# Patient Record
Sex: Male | Born: 1935 | Race: White | Hispanic: No
Health system: Southern US, Community
[De-identification: ages and names within clinical notes are randomized; demographics above are authoritative.]

## PROBLEM LIST (undated history)

## (undated) DIAGNOSIS — I4891 Unspecified atrial fibrillation: Secondary | ICD-10-CM

## (undated) DIAGNOSIS — C439 Malignant melanoma of skin, unspecified: Secondary | ICD-10-CM

## (undated) DIAGNOSIS — N400 Enlarged prostate without lower urinary tract symptoms: Secondary | ICD-10-CM

## (undated) DIAGNOSIS — I10 Essential (primary) hypertension: Secondary | ICD-10-CM

## (undated) DIAGNOSIS — C801 Malignant (primary) neoplasm, unspecified: Secondary | ICD-10-CM

---

## 2005-04-21 ENCOUNTER — Ambulatory Visit: Payer: Self-pay | Admitting: Gastroenterology

## 2006-04-19 ENCOUNTER — Ambulatory Visit (HOSPITAL_BASED_OUTPATIENT_CLINIC_OR_DEPARTMENT_OTHER): Admission: RE | Admit: 2006-04-19 | Discharge: 2006-04-19 | Payer: Self-pay | Admitting: Orthopedic Surgery

## 2006-06-24 ENCOUNTER — Ambulatory Visit: Payer: Self-pay | Admitting: Cardiology

## 2006-06-24 ENCOUNTER — Other Ambulatory Visit: Payer: Self-pay

## 2008-01-02 ENCOUNTER — Ambulatory Visit: Payer: Self-pay | Admitting: Urology

## 2008-01-02 ENCOUNTER — Ambulatory Visit: Payer: Self-pay | Admitting: Cardiology

## 2008-01-02 ENCOUNTER — Other Ambulatory Visit: Payer: Self-pay

## 2008-01-18 ENCOUNTER — Inpatient Hospital Stay: Payer: Self-pay | Admitting: Urology

## 2008-01-21 ENCOUNTER — Emergency Department: Payer: Self-pay | Admitting: Emergency Medicine

## 2008-07-03 ENCOUNTER — Ambulatory Visit: Payer: Self-pay | Admitting: Gastroenterology

## 2009-04-29 ENCOUNTER — Inpatient Hospital Stay (HOSPITAL_COMMUNITY): Admission: RE | Admit: 2009-04-29 | Discharge: 2009-05-01 | Payer: Self-pay | Admitting: Orthopedic Surgery

## 2009-05-21 ENCOUNTER — Encounter: Payer: Self-pay | Admitting: Orthopedic Surgery

## 2009-06-08 ENCOUNTER — Emergency Department: Payer: Self-pay | Admitting: Internal Medicine

## 2009-06-11 ENCOUNTER — Encounter: Payer: Self-pay | Admitting: Orthopedic Surgery

## 2009-07-12 ENCOUNTER — Encounter: Payer: Self-pay | Admitting: Orthopedic Surgery

## 2010-01-27 ENCOUNTER — Ambulatory Visit: Payer: Self-pay | Admitting: Specialist

## 2010-01-29 ENCOUNTER — Ambulatory Visit: Payer: Self-pay | Admitting: Specialist

## 2010-02-04 ENCOUNTER — Ambulatory Visit: Payer: Self-pay | Admitting: Specialist

## 2010-06-29 LAB — CBC
HCT: 43.7 % (ref 39.0–52.0)
HCT: 23.6 % — ABNORMAL LOW (ref 39.0–52.0)
Hemoglobin: 12.1 g/dL — ABNORMAL LOW (ref 13.0–17.0)
Hemoglobin: 8.1 g/dL — ABNORMAL LOW (ref 13.0–17.0)
MCHC: 34.2 g/dL (ref 30.0–36.0)
MCV: 92.7 fL (ref 78.0–100.0)
MCV: 92.8 fL (ref 78.0–100.0)
Platelets: 171 10*3/uL (ref 150–400)
Platelets: 94 10*3/uL — ABNORMAL LOW (ref 150–400)
RDW: 13.9 % (ref 11.5–15.5)
RDW: 14.1 % (ref 11.5–15.5)
WBC: 13.3 10*3/uL — ABNORMAL HIGH (ref 4.0–10.5)
WBC: 8.3 10*3/uL (ref 4.0–10.5)

## 2010-06-29 LAB — DIFFERENTIAL
Basophils Absolute: 0 10*3/uL (ref 0.0–0.1)
Eosinophils Absolute: 0.2 10*3/uL (ref 0.0–0.7)
Eosinophils Relative: 3 % (ref 0–5)
Lymphocytes Relative: 22 % (ref 12–46)
Lymphs Abs: 2 10*3/uL (ref 0.7–4.0)
Monocytes Absolute: 0.8 10*3/uL (ref 0.1–1.0)
Monocytes Relative: 9 % (ref 3–12)
Neutro Abs: 5.9 10*3/uL (ref 1.7–7.7)

## 2010-06-29 LAB — COMPREHENSIVE METABOLIC PANEL
Albumin: 3.7 g/dL (ref 3.5–5.2)
BUN: 17 mg/dL (ref 6–23)
CO2: 24 mEq/L (ref 19–32)
GFR calc Af Amer: >60 mL/min (ref >60)
GFR calc non Af Amer: 59 mL/min — ABNORMAL LOW (ref >60)
Sodium: 139 mEq/L (ref 135–145)
Total Protein: 6.5 g/dL (ref 6.0–8.3)

## 2010-06-29 LAB — URINALYSIS, ROUTINE W REFLEX MICROSCOPIC
Bilirubin Urine: NEGATIVE
Glucose, UA: NEGATIVE mg/dL
Ketones, ur: NEGATIVE mg/dL

## 2010-06-29 LAB — BASIC METABOLIC PANEL
BUN: 18 mg/dL (ref 6–23)
CO2: 26 mEq/L (ref 19–32)
CO2: 27 mEq/L (ref 19–32)
Calcium: 8 mg/dL — ABNORMAL LOW (ref 8.4–10.5)
Chloride: 103 mEq/L (ref 96–112)
GFR calc Af Amer: >60 mL/min (ref >60)
GFR calc non Af Amer: 59 mL/min — ABNORMAL LOW (ref >60)
Glucose, Bld: 113 mg/dL — ABNORMAL HIGH (ref 70–99)
Glucose, Bld: 92 mg/dL (ref 70–99)
Potassium: 4.8 mEq/L (ref 3.5–5.1)
Potassium: 5.6 mEq/L — ABNORMAL HIGH (ref 3.5–5.1)
Sodium: 135 mEq/L (ref 135–145)
Sodium: 140 mEq/L (ref 135–145)

## 2010-06-29 LAB — PREPARE RBC (CROSSMATCH)

## 2010-06-29 LAB — URINE CULTURE: Culture: NO GROWTH

## 2010-06-29 LAB — TYPE AND SCREEN
ABO/RH(D): O POS
Antibody Screen: NEGATIVE

## 2010-06-29 LAB — PROTIME-INR
INR: 2.61 — ABNORMAL HIGH (ref 0.00–1.49)
INR: 1.28 (ref 0.00–1.49)
Prothrombin Time: 15.9 seconds — ABNORMAL HIGH (ref 11.6–15.2)

## 2010-06-29 LAB — URINE MICROSCOPIC-ADD ON

## 2010-06-29 LAB — ABO/RH: ABO/RH(D): O POS

## 2010-07-30 ENCOUNTER — Ambulatory Visit: Payer: Self-pay | Admitting: Specialist

## 2010-08-29 NOTE — Op Note (Signed)
NAME:  Louis Simmons, Louis Simmons                ACCOUNT NO.:  [id number]   MEDICAL RECORD NO.:  [id number]          PATIENT TYPE:  AMB   LOCATION:  DSC                          FACILITY:  [facility]   PHYSICIAN:  Robert A. Thurston Hole, M.D. DATE OF BIRTH:  08/24/1933   DATE OF PROCEDURE:  04/19/2006  DATE OF DISCHARGE:                               OPERATIVE REPORT   PREOPERATIVE DIAGNOSES:  Right knee medial and lateral meniscal tears  with degenerative joint disease, chondromalacia and synovitis.   POSTOPERATIVE DIAGNOSES:  Right knee medial and lateral meniscal tears  with degenerative joint disease, chondromalacia and synovitis.   PROCEDURES:  1. Right knee examination under anesthesia, followed by arthroscopic      partial medial and lateral meniscectomies.  2. Right knee chondroplasty with partial synovectomy.   SURGEON:  Elana Alm. Thurston Hole, M.D.   ASSISTANT:  Julien Girt, P.A.   ANESTHESIA:  Local with MAC.   OPERATIVE TIME:  30 minutes.   COMPLICATIONS:  None.   INDICATIONS FOR PROCEDURES:  [name] is a 76 year old gentleman, who  has had 6 months of increasing right knee pain, with exam and MRI  documenting medial and lateral meniscal tears, possible loose bodies,  chondromalacia, DJD and synovitis.  He has failed conservative care and  is now to undergo arthroscopy.   DESCRIPTION:  [name] was brought into the operating room on April 19, 2006, after a knee block was placed in the holding area by  Anesthesia.  He was placed on the operating table in supine position.  His right knee was examined.  Range of motion from 0 to 125 degrees, 1  to 2+ crepitation, and stable ligamentous exam, with normal patellar  tracking.  The right leg was then prepped using sterile DuraPrep and  draped using sterile technique.  Originally, through an anterolateral  portal, the arthroscope with a pump attached was placed, and through an  anteromedial portal an arthroscopic probe was placed.   On initial  inspection of the medial compartment, he was found to have 20% grade 4,  and the rest, grade 3 chondromalacia, which was debrided, and a medial  meniscal tear, posterior and medial horn, of which 50% was resected back  to a stable rim.  Intercondylar notch inspected.  Anterior and posterior  cruciate ligaments were degenerative, but intact.  There were loose  bodies attached to the soft tissue at the base of the ACL, which were  removed.  Lateral compartment showed 50 to 60% grade 3 chondromalacia,  which was debrided.  Lateral meniscal tearing, posterior and lateral  horn, of which 30 to 40% was resected back to a stable rim.  Patellofemoral joint showed 80% grade 4, and the rest grade 3  chondromalacia and DJD, which was debrided.  The patella tracked  normally.  Moderate synovitis in the medial and lateral gutters,  debrided.  Otherwise, they were free of pathology.  After this was done,  it was felt that all pathology had been satisfactorily addressed.  The  instruments were removed.  The portals were closed with 3-0 nylon  suture.  Sterile dressings were applied.  The patient was awakened and  taken to the recovery room in stable condition.   FOLLOWUP CARE:  [name] is to be followed as an outpatient on  Vicodin.  He will be restarted on his Coumadin tonight per Medicine  recommendations.  He will been back in the office in a week for sutures  out and followup.      Robert A. Thurston Hole, M.D.  Electronically Signed     RAW/MEDQ  D:  04/19/2006  T:  04/19/2006  Job:  [id number]

## 2011-08-06 ENCOUNTER — Emergency Department: Payer: Self-pay | Admitting: Emergency Medicine

## 2011-08-06 LAB — CBC WITH DIFFERENTIAL/PLATELET
Basophil %: 0.7 %
Eosinophil #: 0.3 10*3/uL (ref 0.0–0.7)
HCT: 41.9 % (ref 40.0–52.0)
HGB: 13.8 g/dL (ref 13.0–18.0)
MCH: 31.4 pg (ref 26.0–34.0)
MCHC: 33 g/dL (ref 32.0–36.0)
MCV: 95 fL (ref 80–100)
Monocyte #: 0.8 x10 3/mm (ref 0.2–1.0)
Neutrophil %: 64.6 %
Platelet: 165 10*3/uL (ref 150–440)
WBC: 7.6 10*3/uL (ref 3.8–10.6)

## 2011-08-06 LAB — PROTIME-INR: INR: 2.7

## 2011-09-25 ENCOUNTER — Ambulatory Visit: Payer: Self-pay | Admitting: Urology

## 2011-09-29 ENCOUNTER — Ambulatory Visit: Payer: Self-pay | Admitting: Urology

## 2011-09-29 LAB — BASIC METABOLIC PANEL
BUN: 21 mg/dL — ABNORMAL HIGH (ref 7–18)
Calcium, Total: 8.5 mg/dL (ref 8.5–10.1)
Chloride: 111 mmol/L — ABNORMAL HIGH (ref 98–107)
EGFR (African American): 50 — ABNORMAL LOW
EGFR (Non-African Amer.): 44 — ABNORMAL LOW
Osmolality: 283 (ref 275–301)

## 2011-09-29 LAB — PROTIME-INR: Prothrombin Time: 31.9 secs — ABNORMAL HIGH (ref 11.5–14.7)

## 2011-09-29 LAB — CBC WITH DIFFERENTIAL/PLATELET
Basophil #: 0.1 10*3/uL (ref 0.0–0.1)
Basophil %: 0.9 %
Lymphocyte %: 13.3 %
Monocyte #: 0.7 x10 3/mm (ref 0.2–1.0)
Monocyte %: 9.2 %
Neutrophil #: 5.3 10*3/uL (ref 1.4–6.5)
Neutrophil %: 72.3 %
RBC: 4.43 10*6/uL (ref 4.40–5.90)

## 2011-09-29 LAB — APTT: Activated PTT: 45.3 secs — ABNORMAL HIGH (ref 23.6–35.9)

## 2011-10-05 ENCOUNTER — Ambulatory Visit: Payer: Self-pay | Admitting: Urology

## 2013-04-26 ENCOUNTER — Emergency Department: Payer: Self-pay | Admitting: Internal Medicine

## 2013-04-26 ENCOUNTER — Ambulatory Visit: Payer: Self-pay

## 2013-04-26 LAB — COMPREHENSIVE METABOLIC PANEL
ALT: 18 U/L (ref 12–78)
Albumin: 3.4 g/dL (ref 3.4–5.0)
Alkaline Phosphatase: 81 U/L
Anion Gap: 2 — ABNORMAL LOW (ref 7–16)
BILIRUBIN TOTAL: 0.6 mg/dL (ref 0.2–1.0)
BUN: 25 mg/dL — ABNORMAL HIGH (ref 7–18)
CALCIUM: 8.5 mg/dL (ref 8.5–10.1)
CREATININE: 1.34 mg/dL — AB (ref 0.60–1.30)
Chloride: 111 mmol/L — ABNORMAL HIGH (ref 98–107)
Co2: 27 mmol/L (ref 21–32)
EGFR (African American): 59 — ABNORMAL LOW
EGFR (Non-African Amer.): 51 — ABNORMAL LOW
GLUCOSE: 86 mg/dL (ref 65–99)
Osmolality: 283 (ref 275–301)
Potassium: 4.8 mmol/L (ref 3.5–5.1)
SGOT(AST): 34 U/L (ref 15–37)
SODIUM: 140 mmol/L (ref 136–145)
Total Protein: 7.2 g/dL (ref 6.4–8.2)

## 2013-04-26 LAB — CBC
HCT: 42.8 % (ref 40.0–52.0)
HGB: 14.3 g/dL (ref 13.0–18.0)
MCH: 31.2 pg (ref 26.0–34.0)
MCHC: 33.4 g/dL (ref 32.0–36.0)
MCV: 93 fL (ref 80–100)
Platelet: 185 10*3/uL (ref 150–440)
RBC: 4.59 10*6/uL (ref 4.40–5.90)
RDW: 13.8 % (ref 11.5–14.5)
WBC: 10.3 10*3/uL (ref 3.8–10.6)

## 2013-04-26 LAB — URINALYSIS, COMPLETE
Bacteria: NONE SEEN
Bilirubin,UR: NEGATIVE
Glucose,UR: NEGATIVE mg/dL (ref 0–75)
KETONE: NEGATIVE
Leukocyte Esterase: NEGATIVE
NITRITE: NEGATIVE
PROTEIN: NEGATIVE
Ph: 6 (ref 4.5–8.0)
RBC,UR: 49 /HPF (ref 0–5)
Specific Gravity: 1.019 (ref 1.003–1.030)
Squamous Epithelial: <1

## 2013-04-26 LAB — PROTIME-INR
INR: 2.4
Prothrombin Time: 25.3 secs — ABNORMAL HIGH (ref 11.5–14.7)

## 2013-06-05 ENCOUNTER — Ambulatory Visit: Payer: Self-pay | Admitting: Gastroenterology

## 2013-06-05 LAB — PROTIME-INR
INR: 1.3
PROTHROMBIN TIME: 16.2 s — AB (ref 11.5–14.7)

## 2014-05-14 ENCOUNTER — Ambulatory Visit: Payer: Self-pay

## 2014-08-05 NOTE — H&P (Signed)
PATIENT NAME:  Louis Simmons, Louis Simmons MR#:  [id number] DATE OF BIRTH:  [birth year]  DATE OF ADMISSION:  10/05/2011  CHIEF COMPLAINT: Difficulty voiding and hematuria.   HISTORY OF PRESENT ILLNESS: Louis Simmons is a 81 year old white male who presented with gross hematuria and difficulty voiding. He was evaluated with CT scan which indicated normal kidneys but enlarged prostate with growth into the bladder. Cystoscopy was performed 06/07 confirming enlarged prostate with enlargement and growth of median lobe into the bladder. He has been cleared by his cardiologist to stop Coumadin five days prior to the procedure and comes in today for photovaporization of the prostate with the GreenLight laser.   ALLERGIES: No drug allergies.   CURRENT MEDICATIONS:  1. Coumadin. 2. Metoprolol. 3. Simvastatin. 4. Tiazac.   PAST SURGICAL HISTORY:  1. TUNA 1991. 2. PVP 2009.   SOCIAL HISTORY: Patient denied tobacco or alcohol use.   FAMILY HISTORY: Noncontributory.   PAST AND CURRENT MEDICAL CONDITIONS:  1. Atrial fibrillation.  2. Hypertension.  3. Hypercholesterolemia.   REVIEW OF SYSTEMS: Patient denied chest pain, diabetes, stroke, shortness of breath.    PHYSICAL EXAMINATION:  GENERAL: Well-nourished white male in no acute distress.   HEENT: Sclerae were clear. Pupils were equally round, reactive to light and accommodation. Extraocular movements were intact.   NECK: Supple. No palpable cervical adenopathy.   LUNGS: Clear to auscultation.   CARDIOVASCULAR: Regular rhythm and rate without audible murmurs.   ABDOMEN: Soft, nontender abdomen.   GENITOURINARY: Uncircumcised. Testes are smooth and nontender.   RECTAL: Exam revealed a greater than 50 gram smooth nontender prostate.   NEUROMUSCULAR: Nonfocal.   LABORATORY, DIAGNOSTIC AND RADIOLOGICAL DATA: PSA was 2.4 ng/mL on 06/03.    IMPRESSION:  1. Gross hematuria due to benign prostatic hypertrophy. 2. Bladder outlet obstruction.   PLAN:  Photovaporization of the prostate with the GreenLight laser.   ____________________________ Otelia Limes. Yves Dill, MD mrw:cms D: 09/29/2011 13:26:44 ET T: 09/29/2011 13:53:54 ET JOB#: [id number]  cc: Otelia Limes. Yves Dill, MD, <Dictator>  Royston Cowper MD ELECTRONICALLY SIGNED 09/30/2011 13:11

## 2014-08-05 NOTE — Op Note (Signed)
PATIENT NAME:  PARKER, WHERLEY MR#:  [id number] DATE OF BIRTH:  1933-11-02  DATE OF PROCEDURE:  10/05/2011  PREOPERATIVE DIAGNOSES:  1. Benign prostatic hypertrophy with bladder outlet obstruction.  2. Hematuria.   POSTOPERATIVE DIAGNOSES:  1. Benign prostatic hypertrophy with bladder outlet obstruction.  2. Hematuria.   PROCEDURE: Photovaporization of the prostate with green light laser.   SURGEON: Otelia Limes. Yves Dill, MD   ANESTHETIST: Dr. Kayleen Memos   ANESTHETIC METHOD: General.   INDICATIONS: See the dictated history and physical. After informed consent, the patient requests the above procedure.   OPERATIVE SUMMARY: After adequate general anesthesia had been obtained, the patient was placed into dorsal lithotomy position and the perineum was prepped and draped in the usual fashion. The laser scope was coupled with the camera and then visually advanced into the bladder. Bladder was moderately trabeculated. No bladder tumors were identified. Both ureteral orifices were identified and had clear efflux. The patient had trilobar benign prostatic hypertrophy with large intravesical median lobe extending from about the 6 o'clock position to the 10 o'clock position on the right side of his prostate. At this point the XPS laser fiber was introduced through the scope and set at a power setting of 80 watts. Vaporization of the median lobe and bladder neck tissue was performed. Power was increased to 120 watts and remaining obstructive tissue was vaporized to the level of the verumontanum. At this point the scope was removed and a 20 [place] silicone catheter was placed. Catheter was irrigated until clear. A B and O suppository was placed.    The procedure was then terminated and the patient was transferred to the recovery room in stable condition.   ____________________________ Otelia Limes. Yves Dill, MD mrw:drc D: 10/05/2011 15:35:51 ET T: 10/06/2011 07:47:40 ET JOB#: [id number]  cc: Otelia Limes. Yves Dill, MD,  <Dictator> Royston Cowper MD ELECTRONICALLY SIGNED 10/07/2011 16:58

## 2014-09-28 ENCOUNTER — Other Ambulatory Visit: Payer: Self-pay | Admitting: Infectious Diseases

## 2014-09-28 DIAGNOSIS — R112 Nausea with vomiting, unspecified: Secondary | ICD-10-CM

## 2014-09-28 DIAGNOSIS — K529 Noninfective gastroenteritis and colitis, unspecified: Secondary | ICD-10-CM

## 2014-10-01 ENCOUNTER — Ambulatory Visit: Admission: RE | Admit: 2014-10-01 | Payer: Medicare Other | Source: Ambulatory Visit

## 2014-10-02 ENCOUNTER — Ambulatory Visit: Payer: Medicare Other

## 2014-11-13 ENCOUNTER — Other Ambulatory Visit: Payer: Self-pay | Admitting: Infectious Diseases

## 2014-11-13 DIAGNOSIS — K529 Noninfective gastroenteritis and colitis, unspecified: Secondary | ICD-10-CM

## 2014-11-13 DIAGNOSIS — R112 Nausea with vomiting, unspecified: Secondary | ICD-10-CM

## 2014-11-19 ENCOUNTER — Ambulatory Visit
Admission: RE | Admit: 2014-11-19 | Discharge: 2014-11-19 | Disposition: A | Discharge status: Home / Self Care | Payer: Medicare Other | Source: Ambulatory Visit | Attending: Infectious Diseases | Admitting: Infectious Diseases

## 2014-11-19 DIAGNOSIS — N400 Enlarged prostate without lower urinary tract symptoms: Secondary | ICD-10-CM | POA: Insufficient documentation

## 2014-11-19 DIAGNOSIS — K5730 Diverticulosis of large intestine without perforation or abscess without bleeding: Secondary | ICD-10-CM | POA: Diagnosis not present

## 2014-11-19 DIAGNOSIS — I7090 Unspecified atherosclerosis: Secondary | ICD-10-CM | POA: Insufficient documentation

## 2014-11-19 DIAGNOSIS — R197 Diarrhea, unspecified: Secondary | ICD-10-CM | POA: Diagnosis not present

## 2014-11-19 DIAGNOSIS — R112 Nausea with vomiting, unspecified: Secondary | ICD-10-CM | POA: Insufficient documentation

## 2014-11-19 DIAGNOSIS — I2510 Atherosclerotic heart disease of native coronary artery without angina pectoris: Secondary | ICD-10-CM | POA: Diagnosis not present

## 2014-11-19 DIAGNOSIS — K529 Noninfective gastroenteritis and colitis, unspecified: Secondary | ICD-10-CM

## 2014-11-19 HISTORY — DX: Malignant (primary) neoplasm, unspecified: C80.1

## 2014-11-19 HISTORY — DX: Essential (primary) hypertension: I10

## 2014-11-19 MED ORDER — IOHEXOL 350 MG/ML SOLN
100.0000 mL | Freq: Once | INTRAVENOUS | Status: AC | PRN
  Administered 2014-11-19: 100 mL via INTRAVENOUS

## 2014-11-19 MED ORDER — IOHEXOL 300 MG/ML  SOLN: 100.0000 mL | Freq: Once | INTRAMUSCULAR | Status: DC | PRN

## 2015-03-10 ENCOUNTER — Emergency Department
Admission: EM | Admit: 2015-03-10 | Discharge: 2015-03-10 | Disposition: A | Discharge status: Home / Self Care | Payer: Medicare Other | Attending: Emergency Medicine | Admitting: Emergency Medicine

## 2015-03-10 ENCOUNTER — Emergency Department: Payer: Medicare Other

## 2015-03-10 DIAGNOSIS — Z79899 Other long term (current) drug therapy: Secondary | ICD-10-CM | POA: Insufficient documentation

## 2015-03-10 DIAGNOSIS — I10 Essential (primary) hypertension: Secondary | ICD-10-CM | POA: Diagnosis not present

## 2015-03-10 DIAGNOSIS — R42 Dizziness and giddiness: Secondary | ICD-10-CM

## 2015-03-10 LAB — BASIC METABOLIC PANEL
Anion gap: 5 (ref 5–15)
BUN: 24 mg/dL — AB (ref 6–20)
CHLORIDE: 110 mmol/L (ref 101–111)
CO2: 26 mmol/L (ref 22–32)
CREATININE: 1.26 mg/dL — AB (ref 0.61–1.24)
Calcium: 9 mg/dL (ref 8.9–10.3)
GFR calc Af Amer: >60 mL/min (ref >60)
GFR calc non Af Amer: 52 mL/min — ABNORMAL LOW (ref >60)
Glucose, Bld: 95 mg/dL (ref 65–99)
POTASSIUM: 4.1 mmol/L (ref 3.5–5.1)
SODIUM: 141 mmol/L (ref 135–145)

## 2015-03-10 LAB — CBC
HEMATOCRIT: 46.7 % (ref 40.0–52.0)
Hemoglobin: 15.3 g/dL (ref 13.0–18.0)
MCH: 30.9 pg (ref 26.0–34.0)
MCHC: 32.8 g/dL (ref 32.0–36.0)
MCV: 94.3 fL (ref 80.0–100.0)
PLATELETS: 173 10*3/uL (ref 150–440)
RBC: 4.96 MIL/uL (ref 4.40–5.90)
RDW: 14 % (ref 11.5–14.5)
WBC: 8.4 10*3/uL (ref 3.8–10.6)

## 2015-03-10 LAB — URINALYSIS COMPLETE WITH MICROSCOPIC (ARMC ONLY)
BACTERIA UA: NONE SEEN
BILIRUBIN URINE: NEGATIVE
Glucose, UA: NEGATIVE mg/dL
HGB URINE DIPSTICK: NEGATIVE
KETONES UR: NEGATIVE mg/dL
LEUKOCYTES UA: NEGATIVE
Nitrite: NEGATIVE
PH: 6 (ref 5.0–8.0)
Protein, ur: NEGATIVE mg/dL
SPECIFIC GRAVITY, URINE: 1.016 (ref 1.005–1.030)
SQUAMOUS EPITHELIAL / LPF: NONE SEEN

## 2015-03-10 LAB — GLUCOSE, CAPILLARY: Glucose-Capillary: 89 mg/dL (ref 65–99)

## 2015-03-10 MED ORDER — MECLIZINE HCL 25 MG PO TABS
25.0000 mg | ORAL_TABLET | Freq: Once | ORAL | Status: AC
  Administered 2015-03-10: 25 mg via ORAL
  Filled 2015-03-10: qty 1

## 2015-03-10 MED ORDER — MECLIZINE HCL 25 MG PO TABS: 25.0000 mg | ORAL_TABLET | Freq: Three times a day (TID) | ORAL | Status: AC | PRN

## 2015-03-10 NOTE — ED Notes (Signed)
MD at bedside. 

## 2015-03-10 NOTE — ED Notes (Signed)
Pt reports he woke up around 3 am to use the bathroom and felt dizzy. Checked his blood pressure and it was 228/128. Pt reports still feeling "lightheaded". Denies chest pain or shortness of breath.

## 2015-03-10 NOTE — ED Notes (Signed)
Patient transported to CT 

## 2015-03-10 NOTE — ED Notes (Signed)
Patient returned from CT

## 2015-03-10 NOTE — Discharge Instructions (Signed)
Continue taking her usual medications. Take meclizine if needed for dizziness. Follow with her regular doctor if he continued to have him times. Return to the emergency department if more severe, if you have a headache, or give other urgent concerns. Dizziness Dizziness is a common problem. It is a feeling of unsteadiness or light-headedness. You may feel like you are about to faint. Dizziness can lead to injury if you stumble or fall. Anyone can become dizzy, but dizziness is more common in older adults. This condition can be caused by a number of things, including medicines, dehydration, or illness. HOME CARE INSTRUCTIONS Taking these steps may help with your condition: Eating and Drinking  Drink enough fluid to keep your urine clear or pale yellow. This helps to keep you from becoming dehydrated. Try to drink more clear fluids, such as water.  Do not drink alcohol.  Limit your caffeine intake if directed by your health care provider.  Limit your salt intake if directed by your health care provider. Activity  Avoid making quick movements.  Rise slowly from chairs and steady yourself until you feel okay.  In the morning, first sit up on the side of the bed. When you feel okay, stand slowly while you hold onto something until you know that your balance is fine.  Move your legs often if you need to stand in one place for a long time. Tighten and relax your muscles in your legs while you are standing.  Do not drive or operate heavy machinery if you feel dizzy.  Avoid bending down if you feel dizzy. Place items in your home so that they are easy for you to reach without leaning over. Lifestyle  Do not use any tobacco products, including cigarettes, chewing tobacco, or electronic cigarettes. If you need help quitting, ask your health care provider.  Try to reduce your stress level, such as with yoga or meditation. Talk with your health care provider if you need help. General  Instructions  Watch your dizziness for any changes.  Take medicines only as directed by your health care provider. Talk with your health care provider if you think that your dizziness is caused by a medicine that you are taking.  Tell a friend or a family member that you are feeling dizzy. If he or she notices any changes in your behavior, have this person call your health care provider.  Keep all follow-up visits as directed by your health care provider. This is important. SEEK MEDICAL CARE IF:  Your dizziness does not go away.  Your dizziness or light-headedness gets worse.  You feel nauseous.  You have reduced hearing.  You have new symptoms.  You are unsteady on your feet or you feel like the room is spinning. SEEK IMMEDIATE MEDICAL CARE IF:  You vomit or have diarrhea and are unable to eat or drink anything.  You have problems talking, walking, swallowing, or using your arms, hands, or legs.  You feel generally weak.  You are not thinking clearly or you have trouble forming sentences. It may take a friend or family member to notice this.  You have chest pain, abdominal pain, shortness of breath, or sweating.  Your vision changes.  You notice any bleeding.  You have a headache.  You have neck pain or a stiff neck.  You have a fever.   This information is not intended to replace advice given to you by your health care provider. Make sure you discuss any questions you have with  your health care provider.   Document Released: 09/23/2000 Document Revised: 08/14/2014 Document Reviewed: 03/26/2014 Elsevier Interactive Patient Education [employer].

## 2015-03-10 NOTE — ED Provider Notes (Signed)
[facility] Provider Note  ____________________________________________  Time seen: 5:00  I have reviewed the triage vital signs and the nursing notes.  History by:  Patient and spouse  HISTORY  Chief Complaint Dizziness and Hypertension     HPI Louis Simmons is a 81 y.o. male who had some dizziness last night before went to bed. He made a comment to his wife that he thinks is from his metoprolol. He had noted that dizziness was listed as a possible side effect. He went to bed overall comfortable, however, he awoke at approximately 3 AM to go to the restroom. He felt significantly more dizzy at that time. He did not have any near syncopal symptoms. He returned to bed but remained dizzy. He got back out of bed and checked his blood pressure. He noted it was elevated at 228/128 and then came to the emergency department.  The patient denies any headache. He denies any weakness or focal deficit. He is not having any chest pain or shortness of breath. He is on Eliquis due to history of atrial fibrillation.    Past Medical History  Diagnosis Date  . Cancer     excised melenoma  . Hypertension     There are no active problems to display for this patient.   No past surgical history on file.  Current Outpatient Rx  Name  Route  Sig  Dispense  Refill  . apixaban (ELIQUIS) 5 MG TABS tablet   Oral   Take 5 mg by mouth 2 (two) times daily.         . diphenhydramine-acetaminophen (TYLENOL PM) 25-500 MG TABS tablet   Oral   Take 1 tablet by mouth at bedtime as needed.         . dofetilide (TIKOSYN) 500 MCG capsule   Oral   Take 500 mcg by mouth 2 (two) times daily.         . Melatonin 3 MG TABS   Oral   Take 3 mg by mouth at bedtime as needed.         . metoprolol (LOPRESSOR) 50 MG tablet   Oral   Take 50 mg by mouth 2 (two) times daily.         . Nutritional Supplements (OSTEO ADVANCE) TABS   Oral   Take 1 tablet  by mouth 2 (two) times daily.         . ranitidine (ZANTAC) 150 MG capsule   Oral   Take 300 mg by mouth 2 (two) times daily.         Marland Kitchen senna (SENOKOT) 8.6 MG tablet   Oral   Take 2 tablets by mouth Nightly.         . simvastatin (ZOCOR) 20 MG tablet   Oral   Take 20 mg by mouth daily at 6 PM.         . meclizine (ANTIVERT) 25 MG tablet   Oral   Take 1 tablet (25 mg total) by mouth 3 (three) times daily as needed for dizziness.   12 tablet   0     Allergies Review of patient's allergies indicates no known allergies.  No family history on file.  Social History Social History  Substance Use Topics  . Smoking status: Not on file  . Smokeless tobacco: Not on file  . Alcohol Use: Not on file    Review of Systems  Constitutional: Negative for fever/chills. ENT: Negative for congestion. Cardiovascular: Negative  for chest pain. History of atrial fibrillation. Respiratory: Negative for cough. Gastrointestinal: Negative for abdominal pain, vomiting and diarrhea. Genitourinary: Negative for dysuria. Musculoskeletal: No back pain. Skin: Negative for rash. Neurological: Negative for headache or focal weakness. Positive for vertigo. See history of present illness   10-point ROS otherwise negative.  ____________________________________________   PHYSICAL EXAM:  VITAL SIGNS: ED Triage Vitals  Enc Vitals Group     BP 03/10/15 0433 159/103 mmHg     Pulse Rate 03/10/15 0433 70     Resp 03/10/15 0433 18     Temp 03/10/15 0433 97.5 F (36.4 C)     Temp Source 03/10/15 0433 Oral     SpO2 03/10/15 0433 96 %     Weight 03/10/15 0433 230 lb (104.327 kg)     Height 03/10/15 0433 6\' 3"  (1.905 m)     Head Cir --      Peak Flow --      Pain Score 03/10/15 0429 0     Pain Loc --      Pain Edu? --      Excl. in [place]? --     Constitutional:  Alert and oriented. Well appearing and in no distress. ENT   Head: Normocephalic and atraumatic.   Nose: No  congestion/rhinnorhea.       Mouth: No erythema, no swelling        Ears: Patient wears hearing aids. Upon removal, the ear canals look clear except for a small amount of dry hard wax in the right ear canal. Both TMs are normal appearing. Cardiovascular: Normal rate, regular rhythm, no murmur noted Respiratory:  Normal respiratory effort, no tachypnea.    Breath sounds are clear and equal bilaterally.  Gastrointestinal: Soft, no distention. Nontender Back: No muscle spasm, no tenderness, no CVA tenderness. Musculoskeletal: No deformity noted. Nontender with normal range of motion in all extremities.  No noted edema. Neurologic:  Communicative. Ambulatory. Equal grip strength, good finger to nose motion, negative pronator drift, negative Romberg, 5 over 5 strength in all 4 extremities, cranial nerves II through XII intact.. No gross focal neurologic deficits are appreciated.  Skin:  Skin is warm, dry. No rash noted. Psychiatric: Mood and affect are normal. Speech and behavior are normal.  ____________________________________________    LABS (pertinent positives/negatives)  Labs Reviewed  BASIC METABOLIC PANEL - Abnormal; Notable for the following:    BUN 24 (*)    Creatinine, Ser 1.26 (*)    GFR calc non Af Amer 52 (*)    All other components within normal limits  URINALYSIS COMPLETEWITH MICROSCOPIC ([facility] ONLY) - Abnormal; Notable for the following:    Color, Urine YELLOW (*)    APPearance CLEAR (*)    All other components within normal limits  CBC  GLUCOSE, CAPILLARY  CBG MONITORING, ED     ____________________________________________   EKG  ED ECG REPORT I, Zeke Aker W, the attending physician, personally viewed and interpreted this ECG.   Date: 03/10/2015  EKG Time: 4:30 AM  Rate: 64  Rhythm: Sinus rhythm with first-degree block, PR 244, and frequent PVCs  Axis: Left axis at -46  Intervals: First-degree block with PR interval of 244  ST&T Change: None  noted   ____________________________________________    RADIOLOGY  CT head  IMPRESSION: 1. No acute intracranial pathology seen on CT. 2. Mild small vessel ischemic microangiopathy.   ____________________________________________  INITIAL IMPRESSION / ASSESSMENT AND PLAN / ED COURSE  Pertinent labs & imaging results that were available  during my care of the patient were reviewed by me and considered in my medical decision making (see chart for details).  Well-appearing, pleasant, 81 year old male in no acute distress. He reports he feels better now then at home. His episode was limited. While his blood pressure did increase at home, I suspect this was due to his level of distress or discomfort. His blood pressure here is more reasonable and his symptoms are under control. He has a negative neurologic exam, however, given his symptoms, hypertension at home, and the fact that he is anticoagulated, we will obtain a CT scan to rule out unlikely but possible bleed. We will also treat him with meclizine for his vertigo. If the head CT is within normal limits, the patient will likely be safe and stable to be discharged home. Reassessment pending.  ----------------------------------------- 6:59 AM on 03/10/2015 -----------------------------------------  Patient feels better now. He also looks better. His wife reports his color is better. He is having no dizziness now. His head CT was unremarkable with no acute changes. He is a year to go home.  ____________________________________________   FINAL CLINICAL IMPRESSION(S) / ED DIAGNOSES  Final diagnoses:  Vertigo      Ahmed Prima, MD 03/10/15 [phone]

## 2015-05-01 ENCOUNTER — Other Ambulatory Visit: Payer: Self-pay | Admitting: Infectious Diseases

## 2015-05-01 DIAGNOSIS — R1030 Lower abdominal pain, unspecified: Secondary | ICD-10-CM

## 2015-05-01 DIAGNOSIS — R634 Abnormal weight loss: Secondary | ICD-10-CM

## 2015-05-01 DIAGNOSIS — K219 Gastro-esophageal reflux disease without esophagitis: Secondary | ICD-10-CM

## 2015-05-07 ENCOUNTER — Ambulatory Visit
Admission: RE | Admit: 2015-05-07 | Discharge: 2015-05-07 | Disposition: A | Discharge status: Home / Self Care | Payer: Medicare Other | Source: Ambulatory Visit | Attending: Infectious Diseases | Admitting: Infectious Diseases

## 2015-05-07 DIAGNOSIS — R350 Frequency of micturition: Secondary | ICD-10-CM | POA: Diagnosis not present

## 2015-05-07 DIAGNOSIS — I2510 Atherosclerotic heart disease of native coronary artery without angina pectoris: Secondary | ICD-10-CM | POA: Diagnosis not present

## 2015-05-07 DIAGNOSIS — R634 Abnormal weight loss: Secondary | ICD-10-CM

## 2015-05-07 DIAGNOSIS — K5730 Diverticulosis of large intestine without perforation or abscess without bleeding: Secondary | ICD-10-CM | POA: Insufficient documentation

## 2015-05-07 DIAGNOSIS — K219 Gastro-esophageal reflux disease without esophagitis: Secondary | ICD-10-CM | POA: Diagnosis not present

## 2015-05-07 DIAGNOSIS — R1030 Lower abdominal pain, unspecified: Secondary | ICD-10-CM

## 2015-05-07 MED ORDER — IOHEXOL 300 MG/ML  SOLN
100.0000 mL | Freq: Once | INTRAMUSCULAR | Status: AC | PRN
Start: 2015-05-07 — End: 2015-05-07
  Administered 2015-05-07: 100 mL via INTRAVENOUS

## 2016-08-27 ENCOUNTER — Emergency Department: Payer: Medicare Other

## 2016-08-27 ENCOUNTER — Emergency Department
Admission: EM | Admit: 2016-08-27 | Discharge: 2016-08-27 | Disposition: A | Discharge status: Home / Self Care | Payer: Medicare Other | Attending: Emergency Medicine | Admitting: Emergency Medicine

## 2016-08-27 ENCOUNTER — Encounter: Payer: Self-pay | Admitting: Emergency Medicine

## 2016-08-27 DIAGNOSIS — Z79899 Other long term (current) drug therapy: Secondary | ICD-10-CM | POA: Insufficient documentation

## 2016-08-27 DIAGNOSIS — R1031 Right lower quadrant pain: Secondary | ICD-10-CM | POA: Insufficient documentation

## 2016-08-27 DIAGNOSIS — I10 Essential (primary) hypertension: Secondary | ICD-10-CM | POA: Insufficient documentation

## 2016-08-27 DIAGNOSIS — R110 Nausea: Secondary | ICD-10-CM | POA: Diagnosis not present

## 2016-08-27 DIAGNOSIS — Z85820 Personal history of malignant melanoma of skin: Secondary | ICD-10-CM | POA: Diagnosis not present

## 2016-08-27 HISTORY — DX: Unspecified atrial fibrillation: I48.91

## 2016-08-27 LAB — COMPREHENSIVE METABOLIC PANEL
ALBUMIN: 3.8 g/dL (ref 3.5–5.0)
ALT: 12 U/L — ABNORMAL LOW (ref 17–63)
ANION GAP: 5 (ref 5–15)
AST: 22 U/L (ref 15–41)
Alkaline Phosphatase: 61 U/L (ref 38–126)
BILIRUBIN TOTAL: 0.9 mg/dL (ref 0.3–1.2)
BUN: 22 mg/dL — ABNORMAL HIGH (ref 6–20)
CO2: 23 mmol/L (ref 22–32)
Calcium: 8.7 mg/dL — ABNORMAL LOW (ref 8.9–10.3)
Chloride: 110 mmol/L (ref 101–111)
Creatinine, Ser: 1.49 mg/dL — ABNORMAL HIGH (ref 0.61–1.24)
GFR calc non Af Amer: 42 mL/min — ABNORMAL LOW (ref >60)
GFR, EST AFRICAN AMERICAN: 49 mL/min — AB (ref >60)
GLUCOSE: 90 mg/dL (ref 65–99)
POTASSIUM: 4.9 mmol/L (ref 3.5–5.1)
SODIUM: 138 mmol/L (ref 135–145)
TOTAL PROTEIN: 6.9 g/dL (ref 6.5–8.1)

## 2016-08-27 LAB — CBC
HEMATOCRIT: 43.9 % (ref 40.0–52.0)
HEMOGLOBIN: 14.7 g/dL (ref 13.0–18.0)
MCH: 31.3 pg (ref 26.0–34.0)
MCHC: 33.5 g/dL (ref 32.0–36.0)
MCV: 93.4 fL (ref 80.0–100.0)
Platelets: 192 10*3/uL (ref 150–440)
RBC: 4.69 MIL/uL (ref 4.40–5.90)
RDW: 13.7 % (ref 11.5–14.5)
WBC: 7.9 10*3/uL (ref 3.8–10.6)

## 2016-08-27 LAB — LIPASE, BLOOD: Lipase: 23 U/L (ref 11–51)

## 2016-08-27 LAB — LACTIC ACID, PLASMA: Lactic Acid, Venous: 1 mmol/L (ref 0.5–1.9)

## 2016-08-27 MED ORDER — IOPAMIDOL (ISOVUE-300) INJECTION 61%
75.0000 mL | Freq: Once | INTRAVENOUS | Status: AC | PRN
Start: 2016-08-27 — End: 2016-08-27
  Administered 2016-08-27: 75 mL via INTRAVENOUS
  Filled 2016-08-27: qty 75

## 2016-08-27 NOTE — ED Triage Notes (Signed)
Pt reports RLQ abdominal pain today. Pt states pain came about when he stood up. Pt denies pain at present. Denies NVD. Denies dysuria. Denies fever at home.

## 2016-08-27 NOTE — Discharge Instructions (Signed)
Fortunately today you're labs and CT scan are unremarkable. Please return to the emergency department if he developed any new or worsening symptoms such as if you cannot eat or drink, develop a fever or chills, if your pain worsens, or for any other concerns whatsoever. Otherwise follow up with her primary care physician on Monday for recheck.  It was a pleasure to take care of you today, and thank you for coming to our emergency department.  If you have any questions or concerns before leaving please ask the nurse to grab me and I'm more than happy to go through your aftercare instructions again.  If you were prescribed any opioid pain medication today such as Norco, Vicodin, Percocet, morphine, hydrocodone, or oxycodone please make sure you do not drive when you are taking this medication as it can alter your ability to drive safely.  If you have any concerns once you are home that you are not improving or are in fact getting worse before you can make it to your follow-up appointment, please do not hesitate to call 911 and come back for further evaluation.  Darel Hong MD  Results for orders placed or performed during the hospital encounter of 08/27/16  Lipase, blood  Result Value Ref Range   Lipase 23 11 - 51 U/L  Comprehensive metabolic panel  Result Value Ref Range   Sodium 138 135 - 145 mmol/L   Potassium 4.9 3.5 - 5.1 mmol/L   Chloride 110 101 - 111 mmol/L   CO2 23 22 - 32 mmol/L   Glucose, Bld 90 65 - 99 mg/dL   BUN 22 (H) 6 - 20 mg/dL   Creatinine, Ser 1.49 (H) 0.61 - 1.24 mg/dL   Calcium 8.7 (L) 8.9 - 10.3 mg/dL   Total Protein 6.9 6.5 - 8.1 g/dL   Albumin 3.8 3.5 - 5.0 g/dL   AST 22 15 - 41 U/L   ALT 12 (L) 17 - 63 U/L   Alkaline Phosphatase 61 38 - 126 U/L   Total Bilirubin 0.9 0.3 - 1.2 mg/dL   GFR calc non Af Amer 42 (L) >60 mL/min   GFR calc Af Amer 49 (L) >60 mL/min   Anion gap 5 5 - 15  CBC  Result Value Ref Range   WBC 7.9 3.8 - 10.6 K/uL   RBC 4.69 4.40 -  5.90 MIL/uL   Hemoglobin 14.7 13.0 - 18.0 g/dL   HCT 43.9 40.0 - 52.0 %   MCV 93.4 80.0 - 100.0 fL   MCH 31.3 26.0 - 34.0 pg   MCHC 33.5 32.0 - 36.0 g/dL   RDW 13.7 11.5 - 14.5 %   Platelets 192 150 - 440 K/uL   Ct Abdomen Pelvis W Contrast  Result Date: 08/27/2016 CLINICAL DATA:  Pt reports RLQ abdominal pain today. Pt states pain came about when he stood up. Pt denies pain at present. Denies NVD. Denies dysuria. Denies fever at home. EXAM: CT ABDOMEN AND PELVIS WITH CONTRAST TECHNIQUE: Multidetector CT imaging of the abdomen and pelvis was performed using the standard protocol following bolus administration of intravenous contrast. CONTRAST:  39mL ISOVUE-300 IOPAMIDOL (ISOVUE-300) INJECTION 61% COMPARISON:  05/07/2015 FINDINGS: Lower chest: No acute findings. Lung nodules. 5 mm nodule in the right middle lobe, image 1, incompletely imaged. 6 mm nodule in the left lower lobe, pleural based, image 17. Smaller nodule seen adjacent to this in the left lower lobe. For 5 mm nodule more inferiorly in the left lower lobe, image 20.  These are all stable. Hepatobiliary: Liver is normal size. 1 cm cyst in the inferior right lobe, stable. Subtle area of enhancement in the posterior segment of the right lobe measuring approximately 1 cm, not evident on the prior study. This is either a small portosystemic shunt or hemangioma. It is not visualized on the delayed sequence. No other liver abnormality. Gallbladder is unremarkable. No bile duct dilation. Pancreas: Unremarkable. No pancreatic ductal dilatation or surrounding inflammatory changes. Spleen: Normal in size without focal abnormality. Adrenals/Urinary Tract: No adrenal masses. Several low-density renal lesions, largest from the anterior midpole the right kidney measured 12 mm, most too small fully characterize, but all likely cysts. These are stable from the prior CT. No other renal masses, no stones and no hydronephrosis. Normal ureters. Normal bladder.  Stomach/Bowel: Stomach and small bowel unremarkable. There are multiple left colon diverticula mostly along the sigmoid. No diverticulitis. Colon otherwise unremarkable. Normal appendix visualized. Vascular/Lymphatic: Infrarenal abdominal aorta measures 2.5 cm in diameter. There is mild dilation of the common iliac arteries, the right measuring 15 mm and the left 18 mm. Atherosclerotic disease, relatively mild, is noted throughout the abdominal aorta. No adenopathy. Reproductive: Prostate is enlarged and mildly elevates the posterior inferior bladder base. Other: No abdominal wall hernia or abnormality. No abdominopelvic ascites. Musculoskeletal: No fracture or acute finding. No osteoblastic or osteolytic lesions. IMPRESSION: 1. No acute findings. No findings to account for right lower quadrant abdominal pain. Normal appendix is visualized. 2. Aortic atherosclerosis. Infrarenal abdominal aorta is dilated to 2.5 cm. Ectatic abdominal aorta at risk for aneurysm development. Recommend followup by ultrasound in 5 years. This recommendation follows ACR consensus guidelines: White Paper of the ACR Incidental Findings Committee II on Vascular Findings. J Am Coll Radiol 2013; 10:789-794. 3. Stable lung base nodules, stable on multiple prior studies, benign. 4. Colonic diverticula without evidence of diverticulitis. 5. Prostatic hypertrophy. Electronically Signed   By: Lajean Manes M.D.   On: 08/27/2016 18:41

## 2016-08-27 NOTE — ED Provider Notes (Signed)
[facility] Emergency Department Provider Note  ____________________________________________   First MD Initiated Contact with Patient 08/27/16 1742     (approximate)  I have reviewed the triage vital signs and the nursing notes.   HISTORY  Chief Complaint Abdominal Pain    HPI Louis Simmons is a 83 y.o. male who self presents to the emergency department with 2 hours of moderate severity right lower quadrant cramping pain. Nonradiating. Worse with standing up improved when lying flat.. It is associated with nausea but no vomiting. He has no history of abdominal surgeries. He has no testicular pain. No chest pain or shortness of breath.    Past Medical History:  Diagnosis Date  . Atrial fibrillation ([place])   . Cancer ([place])    excised melenoma  . Hypertension     There are no active problems to display for this patient.   No past surgical history on file.  Prior to Admission medications   Medication Sig Start Date End Date Taking? Authorizing Provider  apixaban (ELIQUIS) 5 MG TABS tablet Take 5 mg by mouth 2 (two) times daily.    [provider]  diphenhydramine-acetaminophen (TYLENOL PM) 25-500 MG TABS tablet Take 1 tablet by mouth at bedtime as needed.    [provider]  dofetilide (TIKOSYN) 500 MCG capsule Take 500 mcg by mouth 2 (two) times daily.    [provider]  meclizine (ANTIVERT) 25 MG tablet Take 1 tablet (25 mg total) by mouth 3 (three) times daily as needed for dizziness. 03/10/15   Ahmed Prima, MD  Melatonin 3 MG TABS Take 3 mg by mouth at bedtime as needed.    [provider]  metoprolol (LOPRESSOR) 50 MG tablet Take 50 mg by mouth 2 (two) times daily.    [provider]  Nutritional Supplements (OSTEO ADVANCE) TABS Take 1 tablet by mouth 2 (two) times daily.    [provider]  ranitidine (ZANTAC) 150 MG capsule Take 300 mg by mouth 2 (two) times daily.    [provider]  senna (SENOKOT) 8.6 MG tablet Take 2 tablets by mouth Nightly.    [provider]  simvastatin (ZOCOR) 20 MG tablet Take 20 mg by mouth daily at 6 PM.    [provider]    Allergies Patient has no known allergies.  No family history on file.  Social History Social History  Substance Use Topics  . Smoking status: Not on file  . Smokeless tobacco: Not on file  . Alcohol use Not on file    Review of Systems Constitutional: No fever/chills Eyes: No visual changes. ENT: No sore throat. Cardiovascular: Denies chest pain. Respiratory: Denies shortness of breath. Gastrointestinal: Positive abdominal pain.  Positive nausea, no vomiting.  No diarrhea.  No constipation. Genitourinary: Negative for dysuria. Musculoskeletal: Negative for back pain. Skin: Negative for rash. Neurological: Negative for headaches, focal weakness or numbness.   ____________________________________________   PHYSICAL EXAM:  VITAL SIGNS: ED Triage Vitals  Enc Vitals Group     BP 08/27/16 1700 139/75     Pulse Rate 08/27/16 1700 67     Resp 08/27/16 1700 15     Temp 08/27/16 1700 97.8 F (36.6 C)     Temp Source 08/27/16 1700 Oral     SpO2 08/27/16 1700 96 %     Weight 08/27/16 1702 233 lb (105.7 kg)     Height 08/27/16 1702 6\' 3"  (1.905 m)     Head  Circumference --      Peak Flow --      Pain Score --      Pain Loc --      Pain Edu? --      Excl. in [place]? --     Constitutional: Alert and oriented x 4 well appearing nontoxic no diaphoresis speaks in full, clear sentences Eyes: PERRL EOMI. Head: Atraumatic. Nose: No congestion/rhinnorhea. Mouth/Throat: No trismus Neck: No stridor.   Cardiovascular: Normal rate, regular rhythm. Grossly normal heart sounds.  Good peripheral circulation. Respiratory: Normal respiratory effort.  No retractions. Lungs CTAB and moving good air Gastrointestinal: Soft nondistended he has tenderness right lower quadrant without  rebound no guarding no peritonitis no hernias appreciated Musculoskeletal: No lower extremity edema   Neurologic:  Normal speech and language. No gross focal neurologic deficits are appreciated. Skin:  Skin is warm, dry and intact. No rash noted. Psychiatric: Mood and affect are normal. Speech and behavior are normal.    ____________________________________________   DIFFERENTIAL  Appendicitis, diverticulitis, gastroenteritis, hernia, epididymitis, testicular torsion ____________________________________________   LABS (all labs ordered are listed, but only abnormal results are displayed)  Labs Reviewed  COMPREHENSIVE METABOLIC PANEL - Abnormal; Notable for the following:       Result Value   BUN 22 (*)    Creatinine, Ser 1.49 (*)    Calcium 8.7 (*)    ALT 12 (*)    GFR calc non Af Amer 42 (*)    GFR calc Af Amer 49 (*)    All other components within normal limits  LIPASE, BLOOD  CBC  LACTIC ACID, PLASMA    Labs unremarkable aside from slight renal dysfunction __________________________________________  EKG   ____________________________________________  RADIOLOGY  CT scan with no acute disease ____________________________________________   PROCEDURES  Procedure(s) performed: no  Procedures  Critical Care performed: no  Observation: no ____________________________________________   INITIAL IMPRESSION / ASSESSMENT AND PLAN / ED COURSE  Pertinent labs & imaging results that were available during my care of the patient were reviewed by me and considered in my medical decision making (see chart for details).  The patient comes to the emergency department 2 hours after sustaining right lower quadrant pain. His exam is relatively benign although he is somewhat tender. I discussed watchful waiting versus CT scan and the patient and his daughter at bedside prefers CT which I think is reasonable. CT is fortunately negative for acute pathology. They understand  that this does not mean that he will not develop appendicitis as this is so early in his course. Strict return precautions given. He is discharged home in improved condition.      ____________________________________________   FINAL CLINICAL IMPRESSION(S) / ED DIAGNOSES  Final diagnoses:  Right lower quadrant abdominal pain      NEW MEDICATIONS STARTED DURING THIS VISIT:  Discharge Medication List as of 08/27/2016  6:51 PM       Note:  This document was prepared using Dragon voice recognition software and may include unintentional dictation errors.     Darel Hong, MD 08/28/16 1322

## 2019-02-06 ENCOUNTER — Other Ambulatory Visit: Payer: Self-pay | Admitting: Neurology

## 2019-02-06 DIAGNOSIS — R413 Other amnesia: Secondary | ICD-10-CM

## 2019-02-16 ENCOUNTER — Ambulatory Visit
Admission: RE | Admit: 2019-02-16 | Discharge: 2019-02-16 | Disposition: A | Discharge status: Home / Self Care | Payer: Medicare Other | Source: Ambulatory Visit | Attending: Neurology | Admitting: Neurology

## 2019-02-16 ENCOUNTER — Other Ambulatory Visit: Payer: Self-pay

## 2019-02-16 DIAGNOSIS — R413 Other amnesia: Secondary | ICD-10-CM | POA: Diagnosis not present

## 2020-01-17 ENCOUNTER — Encounter (HOSPITAL_COMMUNITY): Payer: Self-pay

## 2020-01-17 ENCOUNTER — Emergency Department (HOSPITAL_COMMUNITY): Payer: Medicare Other

## 2020-01-17 ENCOUNTER — Inpatient Hospital Stay (HOSPITAL_COMMUNITY)
Admission: EM | Admit: 2020-01-17 | Discharge: 2020-02-12 | DRG: 082 | Disposition: E | Payer: Medicare Other | Attending: Emergency Medicine | Admitting: Emergency Medicine

## 2020-01-17 DIAGNOSIS — R402212 Coma scale, best verbal response, none, at arrival to emergency department: Secondary | ICD-10-CM | POA: Diagnosis present

## 2020-01-17 DIAGNOSIS — X72XXXA Intentional self-harm by handgun discharge, initial encounter: Secondary | ICD-10-CM | POA: Diagnosis not present

## 2020-01-17 DIAGNOSIS — R402112 Coma scale, eyes open, never, at arrival to emergency department: Secondary | ICD-10-CM | POA: Diagnosis present

## 2020-01-17 DIAGNOSIS — N400 Enlarged prostate without lower urinary tract symptoms: Secondary | ICD-10-CM | POA: Diagnosis present

## 2020-01-17 DIAGNOSIS — I10 Essential (primary) hypertension: Secondary | ICD-10-CM | POA: Diagnosis present

## 2020-01-17 DIAGNOSIS — Z66 Do not resuscitate: Secondary | ICD-10-CM | POA: Diagnosis not present

## 2020-01-17 DIAGNOSIS — Y92009 Unspecified place in unspecified non-institutional (private) residence as the place of occurrence of the external cause: Secondary | ICD-10-CM

## 2020-01-17 DIAGNOSIS — R402312 Coma scale, best motor response, none, at arrival to emergency department: Secondary | ICD-10-CM | POA: Diagnosis present

## 2020-01-17 DIAGNOSIS — Z515 Encounter for palliative care: Secondary | ICD-10-CM | POA: Diagnosis not present

## 2020-01-17 DIAGNOSIS — T1490XA Injury, unspecified, initial encounter: Secondary | ICD-10-CM

## 2020-01-17 DIAGNOSIS — W3400XA Accidental discharge from unspecified firearms or gun, initial encounter: Secondary | ICD-10-CM

## 2020-01-17 DIAGNOSIS — I4891 Unspecified atrial fibrillation: Secondary | ICD-10-CM | POA: Diagnosis present

## 2020-01-17 DIAGNOSIS — J9690 Respiratory failure, unspecified, unspecified whether with hypoxia or hypercapnia: Secondary | ICD-10-CM | POA: Diagnosis present

## 2020-01-17 DIAGNOSIS — S0184XA Puncture wound with foreign body of other part of head, initial encounter: Secondary | ICD-10-CM | POA: Diagnosis present

## 2020-01-17 DIAGNOSIS — I619 Nontraumatic intracerebral hemorrhage, unspecified: Secondary | ICD-10-CM | POA: Diagnosis present

## 2020-01-17 DIAGNOSIS — Z85820 Personal history of malignant melanoma of skin: Secondary | ICD-10-CM | POA: Diagnosis not present

## 2020-01-17 HISTORY — DX: Benign prostatic hyperplasia without lower urinary tract symptoms: N40.0

## 2020-01-17 HISTORY — DX: Malignant melanoma of skin, unspecified: C43.9

## 2020-01-17 HISTORY — DX: Essential (primary) hypertension: I10

## 2020-01-17 LAB — PREPARE FRESH FROZEN PLASMA
Unit division: 0
Unit division: 0

## 2020-01-17 LAB — BPAM FFP
Blood Product Expiration Date: 202110102359
Blood Product Expiration Date: 202110192359
ISSUE DATE / TIME: 202110060655
ISSUE DATE / TIME: 202110060655
Unit Type and Rh: 600
Unit Type and Rh: 6200

## 2020-01-17 LAB — I-STAT CHEM 8, ED
BUN: 22 mg/dL (ref 8–23)
Calcium, Ion: 1.08 mmol/L — ABNORMAL LOW (ref 1.15–1.40)
Chloride: 110 mmol/L (ref 98–111)
Creatinine, Ser: 1.6 mg/dL — ABNORMAL HIGH (ref 0.61–1.24)
Glucose, Bld: 161 mg/dL — ABNORMAL HIGH (ref 70–99)
HCT: 33 % — ABNORMAL LOW (ref 39.0–52.0)
Hemoglobin: 11.2 g/dL — ABNORMAL LOW (ref 13.0–17.0)
Potassium: 3.6 mmol/L (ref 3.5–5.1)
Sodium: 143 mmol/L (ref 135–145)
TCO2: 19 mmol/L — ABNORMAL LOW (ref 22–32)

## 2020-01-17 LAB — BPAM RBC
Blood Product Expiration Date: 202111012359
Blood Product Expiration Date: 202111012359
ISSUE DATE / TIME: 202110060653
ISSUE DATE / TIME: 202110060653
Unit Type and Rh: 5100
Unit Type and Rh: 5100

## 2020-01-17 LAB — SAMPLE TO BLOOD BANK

## 2020-01-17 MED ORDER — DOCUSATE SODIUM 50 MG/5ML PO LIQD
100.0000 mg | Freq: Two times a day (BID) | ORAL | Status: DC
  Filled 2020-01-17: qty 10

## 2020-01-17 MED ORDER — ROCURONIUM BROMIDE 50 MG/5ML IV SOLN
INTRAVENOUS | Status: AC | PRN
  Administered 2020-01-17: 100 mg via INTRAVENOUS

## 2020-01-17 MED ORDER — ACETAMINOPHEN 325 MG PO TABS: 650.0000 mg | ORAL_TABLET | Freq: Four times a day (QID) | ORAL | Status: DC | PRN

## 2020-01-17 MED ORDER — LEVETIRACETAM IN NACL 1000 MG/100ML IV SOLN
1000.0000 mg | Freq: Once | INTRAVENOUS | Status: AC
  Administered 2020-01-17: 1000 mg via INTRAVENOUS

## 2020-01-17 MED ORDER — ACETAMINOPHEN 650 MG RE SUPP: 650.0000 mg | Freq: Four times a day (QID) | RECTAL | Status: DC | PRN

## 2020-01-17 MED ORDER — PANTOPRAZOLE SODIUM 40 MG IV SOLR: 40.0000 mg | Freq: Every day | INTRAVENOUS | Status: DC

## 2020-01-17 MED ORDER — HALOPERIDOL LACTATE 5 MG/ML IJ SOLN: 0.5000 mg | INTRAMUSCULAR | Status: DC | PRN

## 2020-01-17 MED ORDER — ACETAMINOPHEN 160 MG/5ML PO SOLN: 1000.0000 mg | Freq: Four times a day (QID) | ORAL | Status: DC

## 2020-01-17 MED ORDER — FENTANYL CITRATE (PF) 100 MCG/2ML IJ SOLN: 25.0000 ug | INTRAMUSCULAR | Status: DC | PRN

## 2020-01-17 MED ORDER — NOREPINEPHRINE 4 MG/250ML-% IV SOLN
INTRAVENOUS | Status: AC
  Filled 2020-01-17: qty 250

## 2020-01-17 MED ORDER — ONDANSETRON 4 MG PO TBDP: 4.0000 mg | ORAL_TABLET | Freq: Four times a day (QID) | ORAL | Status: DC | PRN

## 2020-01-17 MED ORDER — NOREPINEPHRINE BITARTRATE 1 MG/ML IV SOLN
INTRAVENOUS | Status: AC | PRN
  Administered 2020-01-17: 12 ug via INTRAVENOUS

## 2020-01-17 MED ORDER — BIOTENE DRY MOUTH MT LIQD: 15.0000 mL | OROMUCOSAL | Status: DC | PRN

## 2020-01-17 MED ORDER — HALOPERIDOL LACTATE 2 MG/ML PO CONC
0.5000 mg | ORAL | Status: DC | PRN
  Filled 2020-01-17: qty 0.3

## 2020-01-17 MED ORDER — GLYCOPYRROLATE 1 MG PO TABS
1.0000 mg | ORAL_TABLET | ORAL | Status: DC | PRN
  Filled 2020-01-17: qty 1

## 2020-01-17 MED ORDER — SODIUM CHLORIDE 0.9 % IV SOLN: INTRAVENOUS | Status: DC

## 2020-01-17 MED ORDER — PANTOPRAZOLE SODIUM 40 MG PO TBEC: 40.0000 mg | DELAYED_RELEASE_TABLET | Freq: Every day | ORAL | Status: DC

## 2020-01-17 MED ORDER — GLYCOPYRROLATE 0.2 MG/ML IJ SOLN: 0.2000 mg | INTRAMUSCULAR | Status: DC | PRN

## 2020-01-17 MED ORDER — NOREPINEPHRINE 4 MG/250ML-% IV SOLN
INTRAVENOUS | Status: AC | PRN
  Administered 2020-01-17: 10 ug/min via INTRAVENOUS

## 2020-01-17 MED ORDER — FENTANYL CITRATE (PF) 100 MCG/2ML IJ SOLN: 25.0000 ug | Freq: Once | INTRAMUSCULAR | Status: DC

## 2020-01-17 MED ORDER — SODIUM CHLORIDE 0.9 % IV SOLN
INTRAVENOUS | Status: AC | PRN
  Administered 2020-01-17 (×2): 1000 mL via INTRAVENOUS

## 2020-01-17 MED ORDER — HALOPERIDOL 1 MG PO TABS: 0.5000 mg | ORAL_TABLET | ORAL | Status: DC | PRN

## 2020-01-17 MED ORDER — FENTANYL BOLUS VIA INFUSION
25.0000 ug | INTRAVENOUS | Status: DC | PRN
  Filled 2020-01-17: qty 25

## 2020-01-17 MED ORDER — OXYCODONE HCL 5 MG PO TABS: 5.0000 mg | ORAL_TABLET | ORAL | Status: DC | PRN

## 2020-01-17 MED ORDER — POLYVINYL ALCOHOL 1.4 % OP SOLN
1.0000 [drp] | Freq: Four times a day (QID) | OPHTHALMIC | Status: DC | PRN
  Filled 2020-01-17: qty 15

## 2020-01-17 MED ORDER — ONDANSETRON HCL 4 MG/2ML IJ SOLN: 4.0000 mg | Freq: Four times a day (QID) | INTRAMUSCULAR | Status: DC | PRN

## 2020-01-17 MED ORDER — LEVETIRACETAM IN NACL 500 MG/100ML IV SOLN: 500.0000 mg | Freq: Two times a day (BID) | INTRAVENOUS | Status: DC

## 2020-01-17 MED ORDER — ETOMIDATE 2 MG/ML IV SOLN
INTRAVENOUS | Status: AC | PRN
  Administered 2020-01-17: 20 mg via INTRAVENOUS

## 2020-01-17 MED ORDER — FENTANYL 2500MCG IN NS 250ML (10MCG/ML) PREMIX INFUSION
25.0000 ug/h | INTRAVENOUS | Status: DC
  Filled 2020-01-17: qty 250

## 2020-01-18 ENCOUNTER — Encounter: Payer: Self-pay | Admitting: Emergency Medicine

## 2020-02-12 NOTE — Progress Notes (Signed)
Extubated patient per MD order

## 2020-02-12 NOTE — Progress Notes (Signed)
   02/15/2020 0900  Clinical Encounter Type  Visited With Family  Visit Type Death  Referral From Nurse  Consult/Referral To Chaplain  Spiritual Encounters  Spiritual Needs Emotional;Grief support  Chaplain responded to page. The patient's son Davyd Podgorski) was present in Consult Room A. The doctor spoke with son and the chaplain was present. The chaplain stayed with the son to provide social and emotional support before and after receiving the news of his fathers passing. The chaplain gave the son a patient placement card. The son mentioned [facility]. The chaplain will follow up as needed.
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
## 2020-02-12 NOTE — ED Notes (Signed)
Gasper Lloyd and Mendel Ryder RN bagging hands per request of [employer]

## 2020-02-12 NOTE — ED Notes (Signed)
60 palp. Manual, very faint bp.

## 2020-02-12 NOTE — ED Notes (Signed)
Patient time of death occurred at 80, Pronounced by Dr. Gareth Morgan

## 2020-02-12 NOTE — ED Notes (Signed)
[employer] at bedside with CSI
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
## 2020-02-12 NOTE — ED Notes (Signed)
Provider Lovick informed staff that son does not want any measures. Ordered comfort care.

## 2020-02-12 NOTE — ED Notes (Signed)
This RN listened for heart sounds at two places for 1 minute each. No heart beat noted. No respiratory effort noted. Pt does have a pacemaker that is still functioning. Per Dr. Bobbye Morton use magnet to turn off. Attempted, pacemaker did not turn off.
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
## 2020-02-12 NOTE — ED Notes (Signed)
C-collar removed by Dr. Lovick. °
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
## 2020-02-12 NOTE — ED Notes (Signed)
Intubating pt at this time

## 2020-02-12 DEATH — deceased
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
